# Patient Record
Sex: Male | Born: 1950 | Race: White | Hispanic: No | Marital: Married | Smoking: Never smoker
Health system: Southern US, Community
[De-identification: ages and names within clinical notes are randomized; demographics above are authoritative.]

## PROBLEM LIST (undated history)

## (undated) DIAGNOSIS — I4891 Unspecified atrial fibrillation: Secondary | ICD-10-CM

## (undated) DIAGNOSIS — I499 Cardiac arrhythmia, unspecified: Secondary | ICD-10-CM

---

## 2007-02-16 ENCOUNTER — Ambulatory Visit: Payer: Self-pay | Admitting: Cardiology

## 2019-08-09 DIAGNOSIS — R7989 Other specified abnormal findings of blood chemistry: Secondary | ICD-10-CM | POA: Insufficient documentation

## 2020-06-21 ENCOUNTER — Encounter (INDEPENDENT_AMBULATORY_CARE_PROVIDER_SITE_OTHER): Payer: Self-pay | Admitting: *Deleted

## 2020-07-11 ENCOUNTER — Other Ambulatory Visit (INDEPENDENT_AMBULATORY_CARE_PROVIDER_SITE_OTHER): Payer: Self-pay | Admitting: *Deleted

## 2020-07-11 ENCOUNTER — Encounter (INDEPENDENT_AMBULATORY_CARE_PROVIDER_SITE_OTHER): Payer: Self-pay | Admitting: *Deleted

## 2020-07-11 ENCOUNTER — Telehealth (INDEPENDENT_AMBULATORY_CARE_PROVIDER_SITE_OTHER): Payer: Self-pay | Admitting: *Deleted

## 2020-07-11 MED ORDER — PLENVU 140 G PO SOLR
1.0000 | Freq: Once | ORAL | 0 refills | Status: AC
Start: 2020-07-11 — End: 2020-07-11

## 2020-07-11 NOTE — Telephone Encounter (Signed)
Patient needs Plenvu (copay card) ° °

## 2020-07-11 NOTE — Telephone Encounter (Signed)
Ok to schedule.  Thanks,  [name], MD Gastroenterology and Hepatology [facility]  

## 2020-07-11 NOTE — Telephone Encounter (Signed)
Referring MD/PCP: pradhan   Procedure: tcs (room 1)  Reason/Indication:  screening  Has patient had this procedure before?  Yes, 2011  If so, when, by whom and where?    Is there a family history of colon cancer?  no  Who?  What age when diagnosed?    Is patient diabetic?   no      Does patient have prosthetic heart valve or mechanical valve?  no  Do you have a pacemaker/defibrillator?  no  Has patient ever had endocarditis/atrial fibrillation? no  Does patient use oxygen? no  Has patient had joint replacement within last 12 months?  no  Is patient constipated or do they take laxatives? no  Does patient have a history of alcohol/drug use?  no  Is patient on blood thinner such as Coumadin, Plavix and/or Aspirin? yes  Medications: asa 81 mg daily, allopurinol 300 mg prn, diltiazem 120 mg daily, rosuvastatin 20 mg daily  Allergies: nkda  Medication Adjustment per Dr Rehman/Dr Jenetta Downer   Procedure date & time: 07/30/20

## 2020-07-16 ENCOUNTER — Other Ambulatory Visit (INDEPENDENT_AMBULATORY_CARE_PROVIDER_SITE_OTHER): Payer: Self-pay | Admitting: *Deleted

## 2020-07-16 DIAGNOSIS — Z1211 Encounter for screening for malignant neoplasm of colon: Secondary | ICD-10-CM

## 2020-07-18 ENCOUNTER — Encounter (HOSPITAL_COMMUNITY)
Admission: RE | Admit: 2020-07-18 | Discharge: 2020-07-18 | Disposition: A | Discharge status: Home / Self Care | Payer: Medicare Other | Source: Ambulatory Visit | Attending: Gastroenterology | Admitting: Gastroenterology

## 2020-07-18 ENCOUNTER — Other Ambulatory Visit: Payer: Self-pay

## 2020-07-26 ENCOUNTER — Other Ambulatory Visit: Payer: Self-pay

## 2020-07-26 ENCOUNTER — Other Ambulatory Visit (HOSPITAL_COMMUNITY)
Admission: RE | Admit: 2020-07-26 | Discharge: 2020-07-26 | Disposition: A | Discharge status: Home / Self Care | Payer: Medicare Other | Source: Ambulatory Visit | Attending: Gastroenterology | Admitting: Gastroenterology

## 2020-07-26 DIAGNOSIS — Z01812 Encounter for preprocedural laboratory examination: Secondary | ICD-10-CM | POA: Diagnosis present

## 2020-07-26 DIAGNOSIS — Z20822 Contact with and (suspected) exposure to covid-19: Secondary | ICD-10-CM | POA: Insufficient documentation

## 2020-07-27 LAB — SARS CORONAVIRUS 2 (TAT 6-24 HRS): SARS Coronavirus 2: NEGATIVE

## 2020-07-30 ENCOUNTER — Ambulatory Visit (HOSPITAL_COMMUNITY): Payer: Medicare Other | Admitting: Anesthesiology

## 2020-07-30 ENCOUNTER — Encounter (HOSPITAL_COMMUNITY): Payer: Self-pay | Admitting: Gastroenterology

## 2020-07-30 ENCOUNTER — Other Ambulatory Visit: Payer: Self-pay

## 2020-07-30 ENCOUNTER — Encounter (HOSPITAL_COMMUNITY)
Admission: RE | Disposition: A | Discharge status: Home / Self Care | Payer: Self-pay | Source: Home / Self Care | Attending: Gastroenterology

## 2020-07-30 ENCOUNTER — Ambulatory Visit (HOSPITAL_COMMUNITY)
Admission: RE | Admit: 2020-07-30 | Discharge: 2020-07-30 | Disposition: A | Discharge status: Home / Self Care | Payer: Medicare Other | Attending: Gastroenterology | Admitting: Gastroenterology

## 2020-07-30 DIAGNOSIS — D122 Benign neoplasm of ascending colon: Secondary | ICD-10-CM | POA: Diagnosis not present

## 2020-07-30 DIAGNOSIS — K6389 Other specified diseases of intestine: Secondary | ICD-10-CM | POA: Diagnosis not present

## 2020-07-30 DIAGNOSIS — Z79899 Other long term (current) drug therapy: Secondary | ICD-10-CM | POA: Diagnosis not present

## 2020-07-30 DIAGNOSIS — D123 Benign neoplasm of transverse colon: Secondary | ICD-10-CM

## 2020-07-30 DIAGNOSIS — D120 Benign neoplasm of cecum: Secondary | ICD-10-CM | POA: Diagnosis not present

## 2020-07-30 DIAGNOSIS — Z1211 Encounter for screening for malignant neoplasm of colon: Secondary | ICD-10-CM | POA: Insufficient documentation

## 2020-07-30 DIAGNOSIS — D125 Benign neoplasm of sigmoid colon: Secondary | ICD-10-CM | POA: Insufficient documentation

## 2020-07-30 DIAGNOSIS — I4891 Unspecified atrial fibrillation: Secondary | ICD-10-CM | POA: Insufficient documentation

## 2020-07-30 HISTORY — DX: Cardiac arrhythmia, unspecified: I49.9

## 2020-07-30 HISTORY — PX: COLONOSCOPY WITH PROPOFOL: SHX5780

## 2020-07-30 HISTORY — PX: POLYPECTOMY: SHX5525

## 2020-07-30 SURGERY — COLONOSCOPY WITH PROPOFOL
Anesthesia: General

## 2020-07-30 MED ORDER — CHLORHEXIDINE GLUCONATE CLOTH 2 % EX PADS: 6.0000 | MEDICATED_PAD | Freq: Once | CUTANEOUS | Status: DC

## 2020-07-30 MED ORDER — STERILE WATER FOR IRRIGATION IR SOLN
Status: DC | PRN
  Administered 2020-07-30: 1.5 mL

## 2020-07-30 MED ORDER — PROPOFOL 10 MG/ML IV BOLUS
INTRAVENOUS | Status: DC | PRN
  Administered 2020-07-30: 120 mg via INTRAVENOUS

## 2020-07-30 MED ORDER — LACTATED RINGERS IV SOLN: INTRAVENOUS | Status: DC | PRN

## 2020-07-30 MED ORDER — PROPOFOL 500 MG/50ML IV EMUL
INTRAVENOUS | Status: DC | PRN
  Administered 2020-07-30: 100 ug/kg/min via INTRAVENOUS

## 2020-07-30 MED ORDER — LACTATED RINGERS IV SOLN: Freq: Once | INTRAVENOUS | Status: AC

## 2020-07-30 MED ORDER — LIDOCAINE HCL (CARDIAC) PF 50 MG/5ML IV SOSY
PREFILLED_SYRINGE | INTRAVENOUS | Status: DC | PRN
  Administered 2020-07-30: 100 mg via INTRAVENOUS

## 2020-07-30 NOTE — H&P (Signed)
Chad Espinoza is an 69 y.o. male.   Chief Complaint: Screening colonoscopy HPI: 69 year old male with past medical history of atrial fibrillation, Hyperlipidemia and gout, coming to the hospital for colorectal cancer screening.  The patient denies having any complaints at the moment including melena, hematochezia, change in weight, change in his bowel movement consistency or caliber, abdominal pain or distention.  His last colonoscopy was performed years ago which was unremarkable.  No family history of colorectal cancer.  Past Medical History:  Diagnosis Date  . Dysrhythmia    Atrial Fibrillation    History reviewed. No pertinent surgical history.  Family History  Problem Relation Age of Onset  . Bladder Cancer Father    Social History:  reports that he has never smoked. He has never used smokeless tobacco. He reports current alcohol use. No history on file for drug use.  Allergies: No Known Allergies  Medications Prior to Admission  Medication Sig Dispense Refill  . allopurinol (ZYLOPRIM) 100 MG tablet Take 100 mg by mouth daily.    Marland Kitchen diltiazem (CARDIZEM LA) 120 MG 24 hr tablet Take 120 mg by mouth daily.    . pantoprazole (PROTONIX) 20 MG tablet Take 20 mg by mouth daily.      No results found for this or any previous visit (from the past 48 hour(s)). No results found.  Review of Systems  Constitutional: Negative.   HENT: Negative.   Eyes: Negative.   Respiratory: Negative.   Cardiovascular: Negative.   Gastrointestinal: Negative.   Endocrine: Negative.   Genitourinary: Negative.   Musculoskeletal: Negative.   Skin: Negative.   Allergic/Immunologic: Negative.   Neurological: Negative.   Hematological: Negative.   Psychiatric/Behavioral: Negative.     Blood pressure (!) 165/78, pulse (!) 57, temperature 97.8 F (36.6 C), temperature source Oral, resp. rate 20, height 5\' 7"  (1.702 m), weight 74.8 kg, SpO2 100 %. Physical Exam  GENERAL: The patient is AO x3, in no  acute distress. HEENT: Head is normocephalic and atraumatic. EOMI are intact. Mouth is well hydrated and without lesions. NECK: Supple. No masses LUNGS: Clear to auscultation. No presence of rhonchi/wheezing/rales. Adequate chest expansion HEART: RRR, normal s1 and s2. ABDOMEN: Soft, nontender, no guarding, no peritoneal signs, and nondistended. BS +. No masses. EXTREMITIES: Without any cyanosis, clubbing, rash, lesions or edema. NEUROLOGIC: AOx3, no focal motor deficit. SKIN: no jaundice, no rashes  Assessment/Plan 69 year old male with past medical history of atrial fibrillation, Hyperlipidemia and gout, coming to the hospital for colorectal cancer screening. Patient is at average risk for colorectal cancer.  We will proceed with screening colonoscopy today.  Harvel Quale, MD 07/30/2020, 7:36 AM

## 2020-07-30 NOTE — Anesthesia Preprocedure Evaluation (Addendum)
Anesthesia Evaluation  Patient identified by MRN, date of birth, ID band Patient awake    Reviewed: Allergy & Precautions, NPO status , Patient's Chart, lab work & pertinent test results  History of Anesthesia Complications Negative for: history of anesthetic complications  Airway Mallampati: II  TM Distance: >3 FB Neck ROM: Full    Dental  (+) Teeth Intact   Pulmonary neg pulmonary ROS,    Pulmonary exam normal breath sounds clear to auscultation       Cardiovascular Exercise Tolerance: Good Normal cardiovascular exam+ dysrhythmias Atrial Fibrillation  Rhythm:Regular Rate:Normal     Neuro/Psych negative neurological ROS  negative psych ROS   GI/Hepatic negative GI ROS, Neg liver ROS,   Endo/Other  negative endocrine ROS  Renal/GU negative Renal ROS     Musculoskeletal negative musculoskeletal ROS (+)   Abdominal   Peds  Hematology negative hematology ROS (+)   Anesthesia Other Findings   Reproductive/Obstetrics                            Anesthesia Physical Anesthesia Plan  ASA: II  Anesthesia Plan: General   Post-op Pain Management:    Induction: Intravenous  PONV Risk Score and Plan: TIVA  Airway Management Planned: Nasal Cannula and Natural Airway  Additional Equipment:   Intra-op Plan:   Post-operative Plan:   Informed Consent: I have reviewed the patients History and Physical, chart, labs and discussed the procedure including the risks, benefits and alternatives for the proposed anesthesia with the patient or authorized representative who has indicated his/her understanding and acceptance.     Dental advisory given  Plan Discussed with: CRNA, Surgeon and Anesthesiologist  Anesthesia Plan Comments:        Anesthesia Quick Evaluation

## 2020-07-30 NOTE — Anesthesia Postprocedure Evaluation (Signed)
Anesthesia Post Note  Patient: Chad Espinoza  Procedure(s) Performed: COLONOSCOPY WITH PROPOFOL (N/A ) POLYPECTOMY  Patient location during evaluation: Endoscopy Anesthesia Type: General Level of consciousness: awake and alert and patient cooperative Pain management: satisfactory to patient Vital Signs Assessment: post-procedure vital signs reviewed and stable Respiratory status: spontaneous breathing Cardiovascular status: stable Postop Assessment: no apparent nausea or vomiting Anesthetic complications: no   No complications documented.   Last Vitals:  Vitals:   07/30/20 0708 07/30/20 0817  BP: (!) 165/78 (!) 102/57  Pulse: (!) 57   Resp: 20 16  Temp: 36.6 C 36.4 C  SpO2: 100% 98%    Last Pain:  Vitals:   07/30/20 0817  TempSrc: Oral  PainSc: 0-No pain                 Santosh Petter

## 2020-07-30 NOTE — Discharge Instructions (Signed)
You are being discharged to home.  °Resume your previous diet.  °We are waiting for your pathology results.  °Your physician has recommended a repeat colonoscopy for surveillance based on pathology results.  ° ° °Colonoscopy, Adult, Care After °This sheet gives you information about how to care for yourself after your procedure. Your doctor may also give you more specific instructions. If you have problems or questions, call your doctor. °What can I expect after the procedure? °After the procedure, it is common to have: °· A small amount of blood in your poop (stool) for 24 hours. °· Some gas. °· Mild cramping or bloating in your belly (abdomen). °Follow these instructions at home: °Eating and drinking ° °· Drink enough fluid to keep your pee (urine) pale yellow. °· Follow instructions from your doctor about what you cannot eat or drink. °· Return to your normal diet as told by your doctor. Avoid heavy or fried foods that are hard to digest. °Activity °· Rest as told by your doctor. °· Do not sit for a long time without moving. Get up to take short walks every 1-2 hours. This is important. Ask for help if you feel weak or unsteady. °· Return to your normal activities as told by your doctor. Ask your doctor what activities are safe for you. °To help cramping and bloating: ° °· Try walking around. °· Put heat on your belly as told by your doctor. Use the heat source that your doctor recommends, such as a moist heat pack or a heating pad. °? Put a towel between your skin and the heat source. °? Leave the heat on for 20-30 minutes. °? Remove the heat if your skin turns bright red. This is very important if you are unable to feel pain, heat, or cold. You may have a greater risk of getting burned. °General instructions °· For the first 24 hours after the procedure: °? Do not drive or use machinery. °? Do not sign important documents. °? Do not drink alcohol. °? Do your daily activities more slowly than normal. °? Eat  foods that are soft and easy to digest. °· Take over-the-counter or prescription medicines only as told by your doctor. °· Keep all follow-up visits as told by your doctor. This is important. °Contact a doctor if: °· You have blood in your poop 2-3 days after the procedure. °Get help right away if: °· You have more than a small amount of blood in your poop. °· You see large clumps of tissue (blood clots) in your poop. °· Your belly is swollen. °· You feel like you may vomit (nauseous). °· You vomit. °· You have a fever. °· You have belly pain that gets worse, and medicine does not help your pain. °Summary °· After the procedure, it is common to have a small amount of blood in your poop. You may also have mild cramping and bloating in your belly. °· For the first 24 hours after the procedure, do not drive or use machinery, do not sign important documents, and do not drink alcohol. °· Get help right away if you have a lot of blood in your poop, feel like you may vomit, have a fever, or have more belly pain. °This information is not intended to replace advice given to you by your health care provider. Make sure you discuss any questions you have with your health care provider. °Document Revised: 06/12/2019 Document Reviewed: 06/12/2019 °Elsevier Patient Education © 2020 [employer]vier Inc[employer] ° °Colon Polyps ° °  Polyps are tissue growths inside the body. Polyps can grow in many places, including the large intestine (colon). A polyp may be a round bump or a mushroom-shaped growth. You could have one polyp or several. °Most colon polyps are noncancerous (benign). However, some colon polyps can become cancerous over time. Finding and removing the polyps early can help prevent this. °What are the causes? °The exact cause of colon polyps is not known. °What increases the risk? °You are more likely to develop this condition if you: °· Have a family history of colon cancer or colon polyps. °· Are older than 50 or older than 45 if you  are African American. °· Have inflammatory bowel disease, such as ulcerative colitis or Crohn's disease. °· Have certain hereditary conditions, such as: °? Familial adenomatous polyposis. °? Lynch syndrome. °? Turcot syndrome. °? Peutz-Jeghers syndrome. °· Are overweight. °· Smoke cigarettes. °· Do not get enough exercise. °· Drink too much alcohol. °· Eat a diet that is high in fat and red meat and low in fiber. °· Had childhood cancer that was treated with abdominal radiation. °What are the signs or symptoms? °Most polyps do not cause symptoms. °If you have symptoms, they may include: °· Blood coming from your rectum when having a bowel movement. °· Blood in your stool. The stool may look dark red or black. °· Abdominal pain. °· A change in bowel habits, such as constipation or diarrhea. °How is this diagnosed? °This condition is diagnosed with a colonoscopy. This is a procedure in which a lighted, flexible scope is inserted into the anus and then passed into the colon to examine the area. Polyps are sometimes found when a colonoscopy is done as part of routine cancer screening tests. °How is this treated? °Treatment for this condition involves removing any polyps that are found. Most polyps can be removed during a colonoscopy. Those polyps will then be tested for cancer. Additional treatment may be needed depending on the results of testing. °Follow these instructions at home: °Lifestyle °· Maintain a healthy weight, or lose weight if recommended by your health care provider. °· Exercise every day or as told by your health care provider. °· Do not use any products that contain nicotine or tobacco, such as cigarettes and e-cigarettes. If you need help quitting, ask your health care provider. °· If you drink alcohol, limit how much you have: °? 0-1 drink a day for women. °? 0-2 drinks a day for men. °· Be aware of how much alcohol is in your drink. In the [place] one drink equals one 12 oz bottle of beer (355 mL),  one 5 oz glass of wine (148 mL), or one 1½ oz shot of hard liquor (44 mL). °Eating and drinking ° °· Eat foods that are high in fiber, such as fruits, vegetables, and whole grains. °· Eat foods that are high in calcium and vitamin D, such as milk, cheese, yogurt, eggs, liver, fish, and broccoli. °· Limit foods that are high in fat, such as fried foods and desserts. °· Limit the amount of red meat and processed meat you eat, such as hot dogs, sausage, bacon, and lunch meats. °General instructions °· Keep all follow-up visits as told by your health care provider. This is important. °? This includes having regularly scheduled colonoscopies. °? Talk to your health care provider about when you need a colonoscopy. °Contact a health care provider if: °· You have new or worsening bleeding during a bowel movement. °· You have new   or increased blood in your stool. °· You have a change in bowel habits. °· You lose weight for no known reason. °Summary °· Polyps are tissue growths inside the body. Polyps can grow in many places, including the colon. °· Most colon polyps are noncancerous (benign), but some can become cancerous over time. °· This condition is diagnosed with a colonoscopy. °· Treatment for this condition involves removing any polyps that are found. Most polyps can be removed during a colonoscopy. °This information is not intended to replace advice given to you by your health care provider. Make sure you discuss any questions you have with your health care provider. °Document Revised: 03/03/2018 Document Reviewed: 03/03/2018 °Elsevier Patient Education © 2020 Elsevier Inc. ° °

## 2020-07-30 NOTE — Anesthesia Procedure Notes (Signed)
Date/Time: 07/30/2020 7:35 AM Performed by: Vista Deck, CRNA Pre-anesthesia Checklist: Patient identified, Emergency Drugs available, Suction available, Timeout performed and Patient being monitored Patient Re-evaluated:Patient Re-evaluated prior to induction Oxygen Delivery Method: Nasal Cannula

## 2020-07-30 NOTE — Transfer of Care (Signed)
Immediate Anesthesia Transfer of Care Note  Patient: Chad Espinoza  Procedure(s) Performed: COLONOSCOPY WITH PROPOFOL (N/A ) POLYPECTOMY  Patient Location: Endoscopy Unit  Anesthesia Type:General  Level of Consciousness: awake and patient cooperative  Airway & Oxygen Therapy: Patient Spontanous Breathing  Post-op Assessment: Report given to RN and Post -op Vital signs reviewed and stable  Post vital signs: Reviewed and stable  Last Vitals:  Vitals Value Taken Time  BP    Temp 97.6   Pulse    Resp    SpO2      Last Pain:  Vitals:   07/30/20 0738  TempSrc:   PainSc: 0-No pain      Patients Stated Pain Goal: 7 (68/40/33 5331)  Complications: No complications documented.

## 2020-07-30 NOTE — Op Note (Signed)
[facility] Patient Name: Chad Espinoza Procedure Date: 07/30/2020 7:07 AM MRN: [id number] Date of Birth: 1951-07-04 Attending MD: Maylon Peppers ,  CSN: [id number] Age: 69 Admit Type: Outpatient Procedure:                Colonoscopy Indications:              Screening for colorectal malignant neoplasm Providers:                Maylon Peppers, Caprice Kluver, Charlsie Quest. Theda Sers RN,                            RN, Raphael Gibney, Technician Referring MD:              Medicines:                Monitored Anesthesia Care Complications:            No immediate complications. Estimated Blood Loss:     Estimated blood loss: none. Procedure:                Pre-Anesthesia Assessment:                           - Prior to the procedure, a History and Physical                            was performed, and patient medications, allergies                            and sensitivities were reviewed. The patient's                            tolerance of previous anesthesia was reviewed.                           - The risks and benefits of the procedure and the                            sedation options and risks were discussed with the                            patient. All questions were answered and informed                            consent was obtained.                           - ASA Grade Assessment: II - A patient with mild                            systemic disease.                           After obtaining informed consent, the colonoscope                            was passed under direct vision. Throughout the  procedure, the patient's blood pressure, pulse, and                            oxygen saturations were monitored continuously. The                            [id number]-[id number] ([id number]) scope was introduced through                            the anus and advanced to the the cecum, identified                            by appendiceal orifice and ileocecal  valve. The                            colonoscopy was performed without difficulty. The                            patient tolerated the procedure well. The quality                            of the bowel preparation was excellent. Scope                            withdrawal time was 10 minutes. Scope In: 7:43:00 AM Scope Out: 8:13:35 AM Scope Withdrawal Time: 0 hours 25 minutes 25 seconds  Total Procedure Duration: 0 hours 30 minutes 35 seconds  Findings:      The perianal and digital rectal examinations were normal.      Two sessile polyps were found in the sigmoid colon and cecum. The polyps       were 2 to 3 mm in size. These polyps were removed with a cold biopsy       forceps. Resection and retrieval were complete.      A 5 mm polyp was found in the ascending colon. The polyp was       semi-sessile. The polyp was removed with a cold snare. Resection and       retrieval were complete.      A 8 mm polyp was found in the transverse colon. The polyp was flat. Area       was successfully injected with 4 mL Eleview for a lift polypectomy. The       polyp was removed with a cold snare. Resection and retrieval were       complete.      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      The retroflexed view of the distal rectum and anal verge was normal and       showed no anal or rectal abnormalities. Impression:               - Two 2 to 3 mm polyps in the sigmoid colon and in                            the cecum, removed with a cold biopsy forceps.  Resected and retrieved.                           - One 5 mm polyp in the ascending colon, removed                            with a cold snare. Resected and retrieved.                           - One 8 mm polyp in the transverse colon, removed                            with a cold snare. Resected and retrieved. Injected.                           - Melanosis in the colon.                           -  The distal rectum and anal verge are normal on                            retroflexion view. Moderate Sedation:      Per Anesthesia Care Recommendation:           - Discharge patient to home (ambulatory).                           - Resume previous diet.                           - Await pathology results.                           - Repeat colonoscopy for surveillance based on                            pathology results. Procedure Code(s):        --- Professional ---                           [phone], GC, Colonoscopy, flexible; with removal of                            tumor(s), polyp(s), or other lesion(s) by snare                            technique                           45380, 40, Colonoscopy, flexible; with biopsy,                            single or multiple                           45381, Colonoscopy, flexible; with directed  submucosal injection(s), any substance Diagnosis Code(s):        --- Professional ---                           K63.5, Polyp of colon                           Z12.11, Encounter for screening for malignant                            neoplasm of colon                           K63.89, Other specified diseases of intestine CPT copyright 2019 American [employer]. All rights reserved. The codes documented in this report are preliminary and upon coder review may  be revised to meet current compliance requirements. Maylon Peppers, MD Maylon Peppers,  07/30/2020 8:23:36 AM This report has been signed electronically. Number of Addenda: 0

## 2020-07-30 NOTE — Addendum Note (Signed)
Addendum  created 07/30/20 1240 by Vista Deck, CRNA   Charge Capture section accepted

## 2020-07-31 ENCOUNTER — Encounter (HOSPITAL_COMMUNITY): Payer: Self-pay | Admitting: Gastroenterology

## 2020-07-31 LAB — SURGICAL PATHOLOGY

## 2020-12-06 ENCOUNTER — Other Ambulatory Visit: Payer: Self-pay

## 2020-12-06 ENCOUNTER — Encounter (HOSPITAL_COMMUNITY): Payer: Self-pay

## 2020-12-06 ENCOUNTER — Emergency Department (HOSPITAL_COMMUNITY)
Admission: EM | Admit: 2020-12-06 | Discharge: 2020-12-07 | Disposition: A | Discharge status: Home / Self Care | Payer: Medicare Other | Attending: Emergency Medicine | Admitting: Emergency Medicine

## 2020-12-06 ENCOUNTER — Emergency Department (HOSPITAL_COMMUNITY): Payer: Medicare Other

## 2020-12-06 DIAGNOSIS — I4892 Unspecified atrial flutter: Secondary | ICD-10-CM | POA: Diagnosis not present

## 2020-12-06 DIAGNOSIS — U071 COVID-19: Secondary | ICD-10-CM | POA: Insufficient documentation

## 2020-12-06 DIAGNOSIS — R079 Chest pain, unspecified: Secondary | ICD-10-CM | POA: Diagnosis present

## 2020-12-06 HISTORY — DX: Unspecified atrial fibrillation: I48.91

## 2020-12-06 LAB — BASIC METABOLIC PANEL
Anion gap: 12 (ref 5–15)
BUN: 21 mg/dL (ref 8–23)
CO2: 23 mmol/L (ref 22–32)
Calcium: 10.2 mg/dL (ref 8.9–10.3)
Chloride: 103 mmol/L (ref 98–111)
Creatinine, Ser: 1.34 mg/dL — ABNORMAL HIGH (ref 0.61–1.24)
GFR, Estimated: 57 mL/min — ABNORMAL LOW (ref >60)
Glucose, Bld: 90 mg/dL (ref 70–99)
Potassium: 3.4 mmol/L — ABNORMAL LOW (ref 3.5–5.1)
Sodium: 138 mmol/L (ref 135–145)

## 2020-12-06 LAB — CBC
HCT: 51.6 % (ref 39.0–52.0)
Hemoglobin: 17.6 g/dL — ABNORMAL HIGH (ref 13.0–17.0)
MCH: 31.9 pg (ref 26.0–34.0)
MCHC: 34.1 g/dL (ref 30.0–36.0)
MCV: 93.5 fL (ref 80.0–100.0)
Platelets: 254 10*3/uL (ref 150–400)
RBC: 5.52 MIL/uL (ref 4.22–5.81)
RDW: 12.4 % (ref 11.5–15.5)
WBC: 8.8 10*3/uL (ref 4.0–10.5)
nRBC: 0 % (ref 0.0–0.2)

## 2020-12-06 LAB — TROPONIN I (HIGH SENSITIVITY): Troponin I (High Sensitivity): 5 ng/L (ref <18)

## 2020-12-06 MED ORDER — DILTIAZEM HCL-DEXTROSE 125-5 MG/125ML-% IV SOLN (PREMIX)
5.0000 mg/h | INTRAVENOUS | Status: DC
  Administered 2020-12-06: 5 mg/h via INTRAVENOUS
  Filled 2020-12-06: qty 125

## 2020-12-06 MED ORDER — SODIUM CHLORIDE 0.9 % IV SOLN: INTRAVENOUS | Status: DC

## 2020-12-06 MED ORDER — DILTIAZEM HCL 25 MG/5ML IV SOLN
10.0000 mg | Freq: Once | INTRAVENOUS | Status: AC
  Administered 2020-12-06: 10 mg via INTRAVENOUS
  Filled 2020-12-06: qty 5

## 2020-12-06 NOTE — ED Triage Notes (Signed)
Pt states that he was sick last week, and tested positive for covid on Tuesday.

## 2020-12-06 NOTE — ED Notes (Signed)
Pt reports he has a hx of afib, denies chest pain at this time, verbalizes palpitations, denies dizziness, numb/ting or other neuro symptoms at this time. On cardiac monitoring, 2 PIVs initiated, labs obtained and sent.

## 2020-12-06 NOTE — ED Notes (Signed)
ED Provider at bedside. 

## 2020-12-06 NOTE — ED Provider Notes (Signed)
[facility] EMERGENCY DEPARTMENT Provider Note   CSN: [id number] Arrival date & time: 12/06/20  2133     History Chief Complaint  Patient presents with  . Chest Pain    Chad Espinoza is a 70 y.o. male.  Patient was sick last week.  Had a positive Covid test on Tuesday.  But he was feeling much better.  Today he started with palpitations.  He had a past history of atrial fibrillation.  Not on blood thinners.  Is on diltiazem for that.  No real chest pain no real shortness of breath.  Just sort of a flutter palpitation strange feeling in the chest.  Oxygen saturations on room air 98%.  He is afebrile.  Heart rate 158.        Past Medical History:  Diagnosis Date  . Atrial fibrillation ([place])   . Dysrhythmia    Atrial Fibrillation    There are no problems to display for this patient.   Past Surgical History:  Procedure Laterality Date  . COLONOSCOPY WITH PROPOFOL N/A 07/30/2020   Procedure: COLONOSCOPY WITH PROPOFOL;  Surgeon: Harvel Quale, MD;  Location: [facility];  Service: Gastroenterology;  Laterality: N/A;  730  . POLYPECTOMY  07/30/2020   Procedure: POLYPECTOMY;  Surgeon: Harvel Quale, MD;  Location: [facility];  Service: Gastroenterology;;       Family History  Problem Relation Age of Onset  . Bladder Cancer Father     Social History   Tobacco Use  . Smoking status: Never Smoker  . Smokeless tobacco: Never Used  Vaping Use  . Vaping Use: Never used  Substance Use Topics  . Alcohol use: Yes    Comment: a beer now and then  . Drug use: Never    Home Medications Prior to Admission medications   Medication Sig Start Date End Date Taking? Authorizing Provider  allopurinol (ZYLOPRIM) 100 MG tablet Take 100 mg by mouth daily.    [provider]  diltiazem (CARDIZEM LA) 120 MG 24 hr tablet Take 120 mg by mouth daily.    [provider]  pantoprazole (PROTONIX) 20 MG tablet Take 20 mg by mouth daily.     [provider]    Allergies    Patient has no known allergies.  Review of Systems   Review of Systems  Constitutional: Negative for chills and fever.  HENT: Negative for rhinorrhea and sore throat.   Eyes: Negative for visual disturbance.  Respiratory: Negative for cough and shortness of breath.   Cardiovascular: Positive for palpitations. Negative for chest pain and leg swelling.  Gastrointestinal: Negative for abdominal pain, diarrhea, nausea and vomiting.  Genitourinary: Negative for dysuria.  Musculoskeletal: Negative for back pain and neck pain.  Skin: Negative for rash.  Neurological: Negative for dizziness, light-headedness and headaches.  Hematological: Does not bruise/bleed easily.  Psychiatric/Behavioral: Negative for confusion.    Physical Exam Updated Vital Signs BP (!) 178/104   Pulse (!) 151   Temp 98.1 F (36.7 C)   Resp 19   Ht 1.702 m (5\' 7" )   Wt 77.1 kg   SpO2 98%   BMI 26.63 kg/m   Physical Exam Vitals and nursing note reviewed.  Constitutional:      Appearance: Normal appearance. He is well-developed and well-nourished.  HENT:     Head: Normocephalic and atraumatic.  Eyes:     Extraocular Movements: Extraocular movements intact.     Conjunctiva/sclera: Conjunctivae normal.     Pupils: Pupils are  equal, round, and reactive to light.  Cardiovascular:     Rate and Rhythm: Tachycardia present. Rhythm irregular.     Heart sounds: No murmur heard.   Pulmonary:     Effort: Pulmonary effort is normal. No respiratory distress.     Breath sounds: Normal breath sounds.  Abdominal:     Palpations: Abdomen is soft.     Tenderness: There is no abdominal tenderness.  Musculoskeletal:        General: No swelling or edema. Normal range of motion.     Cervical back: Normal range of motion and neck supple.  Skin:    General: Skin is warm and dry.  Neurological:     General: No focal deficit present.     Mental Status: He is alert and oriented  to person, place, and time.     Cranial Nerves: No cranial nerve deficit.     Sensory: No sensory deficit.     Motor: No weakness.  Psychiatric:        Mood and Affect: Mood and affect normal.     ED Results / Procedures / Treatments   Labs (all labs ordered are listed, but only abnormal results are displayed) Labs Reviewed  BASIC METABOLIC PANEL - Abnormal; Notable for the following components:      Result Value   Potassium 3.4 (*)    Creatinine, Ser 1.34 (*)    GFR, Estimated 57 (*)    All other components within normal limits  CBC - Abnormal; Notable for the following components:   Hemoglobin 17.6 (*)    All other components within normal limits  TROPONIN I (HIGH SENSITIVITY)    EKG EKG Interpretation  Date/Time:  Friday December 06 2020 21:35:26 EST Ventricular Rate:  151 PR Interval:    QRS Duration: 80 QT Interval:  276 QTC Calculation: 437 R Axis:   65 Text Interpretation: Atrial flutter with variable A-V block Marked ST abnormality, possible lateral subendocardial injury Abnormal ECG No previous ECGs available Confirmed by Vanetta MuldersZackowski, Emmarae Cowdery [phone]([id number]) on 12/06/2020 10:02:43 PM   Radiology DG Chest Port 1 View  Result Date: 12/06/2020 CLINICAL DATA:  Palpitations.  COVID. EXAM: PORTABLE CHEST 1 VIEW COMPARISON:  None. FINDINGS: Subsegmental opacities at both lung bases. No confluent consolidation. Heart is normal in size with normal mediastinal contours. No pleural fluid or pneumothorax. No evidence of pneumomediastinum. No pulmonary edema. No acute osseous abnormalities are seen. IMPRESSION: Subsegmental opacities at both lung bases, atelectasis versus pneumonia in the setting of COVID-19. Electronically Signed   By: Narda RutherfordMelanie  Sanford M.D.   On: 12/06/2020 22:56    Procedures Procedures (including critical care time)  CRITICAL CARE Performed by: Vanetta MuldersScott Ife Vitelli Total critical care time: 45 minutes Critical care time was exclusive of separately billable procedures and  treating other patients. Critical care was necessary to treat or prevent imminent or life-threatening deterioration. Critical care was time spent personally by me on the following activities: development of treatment plan with patient and/or surrogate as well as nursing, discussions with consultants, evaluation of patient's response to treatment, examination of patient, obtaining history from patient or surrogate, ordering and performing treatments and interventions, ordering and review of laboratory studies, ordering and review of radiographic studies, pulse oximetry and re-evaluation of patient's condition.   Medications Ordered in ED Medications  0.9 %  sodium chloride infusion ( Intravenous New Bag/Given 12/06/20 2256)  diltiazem (CARDIZEM) 125 mg in dextrose 5% 125 mL (1 mg/mL) infusion (5 mg/hr Intravenous New Bag/Given 12/06/20 2302)  diltiazem (CARDIZEM) injection 10 mg (10 mg Intravenous Given 12/06/20 2258)    ED Course  I have reviewed the triage vital signs and the nursing notes.  Pertinent labs & imaging results that were available during my care of the patient were reviewed by me and considered in my medical decision making (see chart for details).    MDM Rules/Calculators/A&P                          Patient with atrial flutter with RVR.  Patient given 10 mg diltiazem bolus without any significant change.  Started on a drip.  Which is being titrated.  Heart rate now is waxing and waning from low 100s up to the 130s.  Still has a lot of flutter waves.  Chest x-ray raises some concerns about Covid pneumonia.  But patient very asymptomatic not febrile.  No leukocytosis.  Patient was symptomatic a week ago with symptoms probably consistent with Covid.  Patient has had both vaccines.  But had not had the booster.  Patient said that he was feeling much better from that this week and really had any symptoms.  Until the palpitations started today.  No significant chest pain with them.  Gust  with hospitalist for admission.     Final Clinical Impression(s) / ED Diagnoses Final diagnoses:  Atrial flutter with rapid ventricular response ([place])  COVID    Rx / DC Orders ED Discharge Orders    None       Fredia Sorrow, MD 12/06/20 2333

## 2020-12-06 NOTE — ED Triage Notes (Signed)
Pt to er, pt states that he has a hx of a fib, states that he feels like he is in a fib, pt c/o chest pain.  Pt denies dizziness.

## 2020-12-07 NOTE — ED Notes (Signed)
Pt. Was able to ambulate around the room without assistance O2 saturation maintained 100% while ambulating on room air. No wheezing or difficulty breathing noted by staff or pt.

## 2020-12-07 NOTE — ED Notes (Signed)
Pt spouse called and updated via telephone at [phone], spouse will pick up pt at time of discharge

## 2020-12-07 NOTE — ED Provider Notes (Signed)
Patient feels back to baseline He is now back in sinus rhythm  BP 134/80   Pulse 63   Temp 98.1 F (36.7 C)   Resp 16   Ht 1.702 m (5\' 7" )   Wt 77.1 kg   SpO2 97%   BMI 26.63 kg/m   EKG Interpretation  Date/Time:  Saturday December 07 2020 01:13:56 EST Ventricular Rate:  65 PR Interval:    QRS Duration: 88 QT Interval:  397 QTC Calculation: 413 R Axis:   40 Text Interpretation: Sinus rhythm Low voltage, precordial leads Borderline T wave abnormalities Confirmed by Ripley Fraise [id number]) on 12/07/2020 1:18:53 AM      Patient feels comfortable for discharge home.  His vital signs are appropriate He reports his cardiologist never wanted him to be on anticoagulation.  This patients CHA2DS2-VASc Score and unadjusted Ischemic Stroke Rate (% per year) is equal to 0.6 % stroke rate/year from a score of 1  Above score calculated as 1 point each if present [CHF, HTN, DM, Vascular=MI/PAD/Aortic Plaque, Age if 65-74, or Male] Above score calculated as 2 points each if present [Age > 75, or Stroke/TIA/TE]     Ripley Fraise, MD 12/07/20 [phone]

## 2021-05-08 ENCOUNTER — Ambulatory Visit (INDEPENDENT_AMBULATORY_CARE_PROVIDER_SITE_OTHER): Payer: Medicare Other

## 2021-05-08 ENCOUNTER — Ambulatory Visit: Payer: Medicare Other | Admitting: Podiatry

## 2021-05-08 ENCOUNTER — Other Ambulatory Visit: Payer: Self-pay

## 2021-05-08 ENCOUNTER — Encounter: Payer: Self-pay | Admitting: Podiatry

## 2021-05-08 DIAGNOSIS — I471 Supraventricular tachycardia, unspecified: Secondary | ICD-10-CM | POA: Insufficient documentation

## 2021-05-08 DIAGNOSIS — U071 COVID-19: Secondary | ICD-10-CM | POA: Insufficient documentation

## 2021-05-08 DIAGNOSIS — N189 Chronic kidney disease, unspecified: Secondary | ICD-10-CM | POA: Insufficient documentation

## 2021-05-08 DIAGNOSIS — M2011 Hallux valgus (acquired), right foot: Secondary | ICD-10-CM

## 2021-05-08 DIAGNOSIS — I10 Essential (primary) hypertension: Secondary | ICD-10-CM

## 2021-05-08 DIAGNOSIS — M778 Other enthesopathies, not elsewhere classified: Secondary | ICD-10-CM

## 2021-05-08 DIAGNOSIS — Z9889 Other specified postprocedural states: Secondary | ICD-10-CM | POA: Insufficient documentation

## 2021-05-08 DIAGNOSIS — E875 Hyperkalemia: Secondary | ICD-10-CM | POA: Insufficient documentation

## 2021-05-08 DIAGNOSIS — J309 Allergic rhinitis, unspecified: Secondary | ICD-10-CM | POA: Insufficient documentation

## 2021-05-08 DIAGNOSIS — E663 Overweight: Secondary | ICD-10-CM | POA: Insufficient documentation

## 2021-05-08 DIAGNOSIS — Z87891 Personal history of nicotine dependence: Secondary | ICD-10-CM | POA: Insufficient documentation

## 2021-05-08 DIAGNOSIS — R29818 Other symptoms and signs involving the nervous system: Secondary | ICD-10-CM | POA: Insufficient documentation

## 2021-05-08 DIAGNOSIS — E7800 Pure hypercholesterolemia, unspecified: Secondary | ICD-10-CM | POA: Insufficient documentation

## 2021-05-08 DIAGNOSIS — I480 Paroxysmal atrial fibrillation: Secondary | ICD-10-CM | POA: Insufficient documentation

## 2021-05-08 DIAGNOSIS — I4892 Unspecified atrial flutter: Secondary | ICD-10-CM | POA: Insufficient documentation

## 2021-05-08 DIAGNOSIS — M722 Plantar fascial fibromatosis: Secondary | ICD-10-CM | POA: Insufficient documentation

## 2021-05-08 DIAGNOSIS — M109 Gout, unspecified: Secondary | ICD-10-CM | POA: Insufficient documentation

## 2021-05-08 DIAGNOSIS — Z2821 Immunization not carried out because of patient refusal: Secondary | ICD-10-CM | POA: Insufficient documentation

## 2021-05-08 DIAGNOSIS — N289 Disorder of kidney and ureter, unspecified: Secondary | ICD-10-CM | POA: Insufficient documentation

## 2021-05-08 DIAGNOSIS — Z0000 Encounter for general adult medical examination without abnormal findings: Secondary | ICD-10-CM | POA: Insufficient documentation

## 2021-05-08 DIAGNOSIS — Z789 Other specified health status: Secondary | ICD-10-CM | POA: Insufficient documentation

## 2021-05-08 HISTORY — DX: Essential (primary) hypertension: I10

## 2021-05-08 MED ORDER — TRIAMCINOLONE ACETONIDE 40 MG/ML IJ SUSP
20.0000 mg | Freq: Once | INTRAMUSCULAR | Status: AC
  Administered 2021-05-08: 20 mg

## 2021-05-11 NOTE — Progress Notes (Signed)
Subjective:  Patient ID: Chad Espinoza, male    DOB: 23-Apr-1951,  MRN: [id number] HPI Chief Complaint  Patient presents with   Foot Pain    1st MPJ right - bunion deformity x years, episodes of gout x 4, most recent March 8th, area is still red and aching, taking Allopurinol daily   New Patient (Initial Visit)    70 y.o. male presents with the above complaint.   ROS: Denies fever chills nausea vomit muscle aches pains calf pain back pain chest pain shortness of breath.  History of episodes of gout attack to the first metatarsophalangeal joint of the right foot.  Most recent 1 was in March he said the area is still red and tender.  Past Medical History:  Diagnosis Date   Atrial fibrillation [facility])    Dysrhythmia    Atrial Fibrillation   Hypertension 05/08/2021   Past Surgical History:  Procedure Laterality Date   COLONOSCOPY WITH PROPOFOL N/A 07/30/2020   Procedure: COLONOSCOPY WITH PROPOFOL;  Surgeon: Harvel Quale, MD;  [facility];  Service: Gastroenterology;  Laterality: N/A;  730   POLYPECTOMY  07/30/2020   Procedure: POLYPECTOMY;  Surgeon: Montez Morita, Quillian Quince, MD;  Location: [facility];  Service: Gastroenterology;;    Current Outpatient Medications:    allopurinol (ZYLOPRIM) 300 MG tablet, Take 300 mg by mouth daily., Disp: , Rfl:    diltiazem (CARDIZEM LA) 120 MG 24 hr tablet, Take 120 mg by mouth daily., Disp: , Rfl:    ELIQUIS 5 MG TABS tablet, Take 1 tablet by mouth 2 (two) times daily., Disp: , Rfl:    fluorouracil (EFUDEX) 5 % cream, Apply topically., Disp: , Rfl:    omeprazole (PRILOSEC) 20 MG capsule, Take 1 capsule by mouth daily., Disp: , Rfl:    rosuvastatin (CRESTOR) 20 MG tablet, Take 20 mg by mouth daily., Disp: , Rfl:    sildenafil (REVATIO) 20 MG tablet, SMARTSIG:1-5 Tablet(s) By Mouth Daily PRN, Disp: , Rfl:    traMADol (ULTRAM) 50 MG tablet, Take 50 mg by mouth every 6 (six) hours as needed., Disp: , Rfl:   Allergies   Allergen Reactions   Other     Red Meat   Review of Systems Objective:  There were no vitals filed for this visit.  General: Well developed, nourished, in no acute distress, alert and oriented x3   Dermatological: Skin is warm, dry and supple bilateral. Nails x 10 are well maintained; remaining integument appears unremarkable at this time. There are no open sores, no preulcerative lesions, no rash or signs of infection present.  Vascular: Dorsalis Pedis artery and Posterior Tibial artery pedal pulses are 2/4 bilateral with immedate capillary fill time. Pedal hair growth present. No varicosities and no lower extremity edema present bilateral.   Neruologic: Grossly intact via light touch bilateral. Vibratory intact via tuning fork bilateral. Protective threshold with Semmes Wienstein monofilament intact to all pedal sites bilateral. Patellar and Achilles deep tendon reflexes 2+ bilateral. No Babinski or clonus noted bilateral.   Musculoskeletal: No gross boney pedal deformities bilateral. No pain, crepitus, or limitation noted with foot and ankle range of motion bilateral. Muscular strength 5/5 in all groups tested bilateral.  Hallux abductovalgus deformity right foot.  With mild erythema mild edema on palpation.  Mild tenderness on range of motion.  Mild tenderness on palpation.  Gait: Unassisted, Nonantalgic.    Radiographs:  Radiographs taken today do demonstrate an increase in the first intermetatarsal angle greater than normal value of  an osseously mature individual.  Hallux abductus angles is greater than normal value.  Hypertrophic medial condyle is also present it appears that this is very irregular much as if it has had several gout attacks.  There is also joint space narrowing consistent with gout attacks and seropositive arthropathies.  He does also have soft tissue swelling around the first metatarsophalangeal joint.  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    Plan: Discussed etiology pathology and surgical therapies discussed surgical therapy with he and his wife in great detail today they understand this and is amendable to it.  They will follow-up with me in the near future.  Surgical consideration.     Tandy Lewin T. [place], [place]

## 2021-10-28 ENCOUNTER — Ambulatory Visit: Payer: Medicare Other | Admitting: Podiatry

## 2021-11-27 ENCOUNTER — Ambulatory Visit: Payer: Medicare Other | Admitting: Podiatry

## 2021-11-27 ENCOUNTER — Other Ambulatory Visit: Payer: Self-pay

## 2021-11-27 ENCOUNTER — Encounter: Payer: Self-pay | Admitting: Podiatry

## 2021-11-27 DIAGNOSIS — M2011 Hallux valgus (acquired), right foot: Secondary | ICD-10-CM | POA: Diagnosis not present

## 2021-11-27 NOTE — Progress Notes (Signed)
He presents with his wife today to discuss the capsulitis of the first metatarsophalangeal joint.  He states that he is 100% better and has absolutely no pain associated with this at all and only time he gets tenderness and it is when he is tight in his shoe.  He states that he is currently had no gout attacks since I saw him last.  Objective: Vital signs are stable alert and oriented x3.  Pulses are palpable.  There is no edema cellulitis drainage or odor primarily hallux valgus deformity with hypertrophic medial condyle.  He has great range of motion no crepitation.  Assessment: Hallux abductovalgus deformity history of gout.  Plan: At this point we discussed correction of this deformity and as well as padding.  I provided him with padding today I will follow-up with him in October for surgery in late December or early January for correction of this right first metatarsophalangeal joint.  Should this patient call stating that he has gout and requesting blood work I would like a complete metabolic panel and arthritic panel including a uric acid as well as a CBC.  He is to be provided with a requisition and appointment made immediately

## 2021-11-30 IMAGING — DX DG CHEST 1V PORT
1 series · 1 of 1 positions shown · non-contrast
Comparison: None.

CLINICAL DATA: Palpitations.  COVID.

EXAM:
PORTABLE CHEST 1 VIEW

[chest ap]
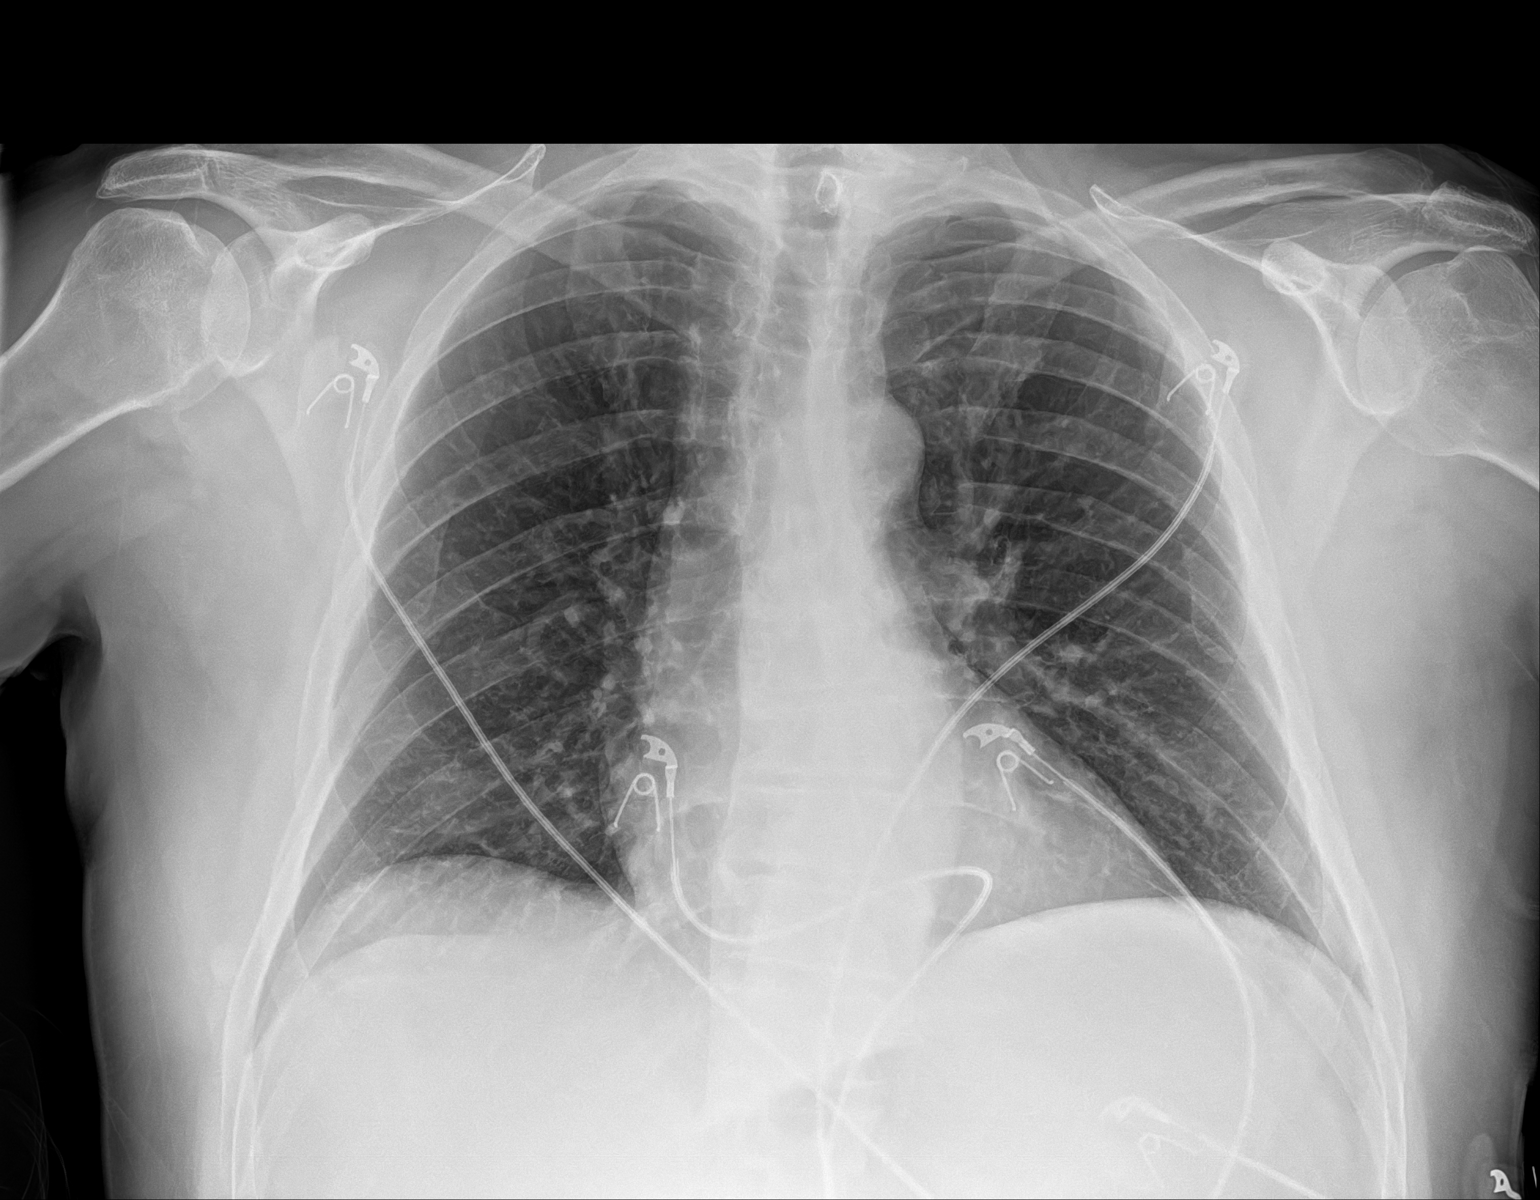

[1 of 1 positions shown; findings below may reference images not displayed]

FINDINGS: Subsegmental opacities at both lung bases. No confluent
consolidation. Heart is normal in size with normal mediastinal
contours. No pleural fluid or pneumothorax. No evidence of
pneumomediastinum. No pulmonary edema. No acute osseous
abnormalities are seen.
IMPRESSION: Subsegmental opacities at both lung bases, atelectasis versus
pneumonia in the setting of [id number].

## 2022-09-15 ENCOUNTER — Ambulatory Visit: Payer: Medicare Other | Admitting: Podiatry

## 2022-12-31 ENCOUNTER — Ambulatory Visit: Payer: Medicare Other | Admitting: Podiatry

## 2022-12-31 DIAGNOSIS — M778 Other enthesopathies, not elsewhere classified: Secondary | ICD-10-CM

## 2023-07-15 ENCOUNTER — Encounter (INDEPENDENT_AMBULATORY_CARE_PROVIDER_SITE_OTHER): Payer: Self-pay | Admitting: *Deleted

## 2024-08-10 ENCOUNTER — Ambulatory Visit: Admitting: Podiatry
